# Patient Record
Sex: Male | Born: 1995 | Hispanic: Yes | Marital: Single | Smoking: Never smoker
Health system: Southern US, Community
[De-identification: ages and names within clinical notes are randomized; demographics above are authoritative.]

## PROBLEM LIST (undated history)

## (undated) HISTORY — PX: NO PAST SURGERIES: SHX2092

---
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
## 2017-02-05 ENCOUNTER — Emergency Department
Admission: EM | Admit: 2017-02-05 | Discharge: 2017-02-05 | Disposition: A | Discharge status: Home / Self Care | Payer: Medicaid Other | Attending: Emergency Medicine | Admitting: Emergency Medicine

## 2017-02-05 DIAGNOSIS — J029 Acute pharyngitis, unspecified: Secondary | ICD-10-CM | POA: Insufficient documentation

## 2017-02-05 MED ORDER — AMOXICILLIN 500 MG PO TABS
500.0000 mg | ORAL_TABLET | Freq: Three times a day (TID) | ORAL | 0 refills | Status: DC
Start: 2017-02-05 — End: 2022-01-13

## 2017-02-05 NOTE — ED Provider Notes (Signed)
[facility] Emergency Department Provider Note  ____________________________________________  Time seen: Approximately 7:28 PM  I have reviewed the triage vital signs and the nursing notes.   HISTORY  Chief Complaint URI   HPI Colton Armstrong is a 21 y.o. male who presents to the emergency department with left ear pain and sore throat for the past 2 weeks. He has been taking some over-the-counter pain relievers without relief.   History reviewed. No pertinent past medical history.  There are no active problems to display for this patient.   History reviewed. No pertinent surgical history.  Prior to Admission medications   Medication Sig Start Date End Date Taking? Authorizing Provider  amoxicillin (AMOXIL) 500 MG tablet Take 1 tablet (500 mg total) by mouth 3 (three) times daily. 02/05/17   Chinita Pesterari B Satoya Feeley, FNP    Allergies Patient has no known allergies.  No family history on file.  Social History Social History  Substance Use Topics  . Smoking status: Never Smoker  . Smokeless tobacco: Never Used  . Alcohol use No    Review of Systems Constitutional: Negative for fever/chills ENT: Positive for sore throat. Positive for left earache. Cardiovascular: Denies chest pain. Respiratory: Negative for shortness of breath. Negative for cough. Gastrointestinal: Negative for nausea,  negative for vomiting.  No diarrhea.  Musculoskeletal: Negative for body aches Skin: Negative for rash. Neurological: Negative for headaches ____________________________________________   PHYSICAL EXAM:  VITAL SIGNS: ED Triage Vitals  Enc Vitals Group     BP 02/05/17 1912 125/75     Pulse Rate 02/05/17 1911 (!) 101     Resp 02/05/17 1911 18     Temp 02/05/17 1911 98.7 F (37.1 C)     Temp Source 02/05/17 1911 Oral     SpO2 02/05/17 1911 100 %     Weight --      Height --      Head Circumference --      Peak Flow --      Pain Score 02/05/17 1858  6     Pain Loc --      Pain Edu? --      Excl. in GC? --     Constitutional: Alert and oriented. Uncomfortable appearing and in no acute distress. Eyes: Conjunctivae are normal. EOMI. Ears: Bilateral tympanic membranes are normal without signs of OM Nose: No congestion noted; no rhinnorhea. Mouth/Throat: Mucous membranes are moist.  Oropharynx erythematous. Tonsils 2+ without exudate. Neck: No stridor.  Lymphatic: Bilateral anterior cervical lymphadenopathy. Cardiovascular: Normal rate, regular rhythm. Good peripheral circulation. Respiratory: Normal respiratory effort.  No retractions. Breath sounds clear to auscultation throughout. Gastrointestinal: Soft and nontender.  Musculoskeletal: FROM x 4 extremities.  Neurologic:  Normal speech and language.  Skin:  Skin is warm, dry and intact. No rash noted. Psychiatric: Mood and affect are normal. Speech and behavior are normal.  ____________________________________________   LABS (all labs ordered are listed, but only abnormal results are displayed)  Labs Reviewed - No data to display ____________________________________________  EKG  Not indicated ____________________________________________  RADIOLOGY  Not indicated ____________________________________________   PROCEDURES  Procedure(s) performed: None  Critical Care performed: No ____________________________________________   INITIAL IMPRESSION / ASSESSMENT AND PLAN / ED COURSE  21 year old male bruising to the emergency department for a 2 week history of sore throat and has now developed a left earache. He'll be treated symptomatically with amoxicillin and advised to take Tylenol or ibuprofen for pain or fever. He is instructed to follow-up with  the primary care provider for choice for symptoms that are not improving over the next few days or return to emergency department for symptoms that change or worsen if he is unable to schedule an appointment.  Pertinent  labs & imaging results that were available during my care of the patient were reviewed by me and considered in my medical decision making (see chart for details).  Discharge Medication List as of 02/05/2017  8:12 PM    START taking these medications   Details  amoxicillin (AMOXIL) 500 MG tablet Take 1 tablet (500 mg total) by mouth 3 (three) times daily., Starting Sun 02/05/2017, Print        If controlled substance prescribed during this visit, 12 month history viewed on the [place] prior to issuing an initial prescription for Schedule II or III opiod. ____________________________________________   FINAL CLINICAL IMPRESSION(S) / ED DIAGNOSES  Final diagnoses:  Pharyngitis, unspecified etiology    Note:  This document was prepared using Dragon voice recognition software and may include unintentional dictation errors.     Chinita Pester, FNP 02/05/17 2332    Jennye Moccasin, MD 02/05/17 [phone]

## 2017-02-05 NOTE — ED Notes (Signed)
E-signature not working in room at this time. Computer has been restarted and pt verbalized that he had no further questions.

## 2017-02-05 NOTE — Discharge Instructions (Signed)
Please take tylenol or ibuprofen if needed for pain or fever. Follow up with the primary care provider of your choice for symptoms that are not improving over the next few days. Return to the ER for symptoms that change or worsen if unable to schedule an appointment.

## 2017-02-05 NOTE — ED Triage Notes (Signed)
Pt c/o left ear pain with sore throat for the past 2 weeks..Marland Kitchen

## 2017-03-05 ENCOUNTER — Emergency Department
Admission: EM | Admit: 2017-03-05 | Discharge: 2017-03-05 | Disposition: A | Discharge status: Home / Self Care | Payer: Self-pay | Attending: Emergency Medicine | Admitting: Emergency Medicine

## 2017-03-05 ENCOUNTER — Emergency Department: Payer: Self-pay

## 2017-03-05 DIAGNOSIS — J36 Peritonsillar abscess: Secondary | ICD-10-CM | POA: Insufficient documentation

## 2017-03-05 DIAGNOSIS — Z792 Long term (current) use of antibiotics: Secondary | ICD-10-CM | POA: Insufficient documentation

## 2017-03-05 LAB — CBC
HCT: 41.7 % (ref 40.0–52.0)
Hemoglobin: 14.5 g/dL (ref 13.0–18.0)
MCH: 31.6 pg (ref 26.0–34.0)
MCHC: 34.9 g/dL (ref 32.0–36.0)
MCV: 90.4 fL (ref 80.0–100.0)
Platelets: 233 10*3/uL (ref 150–440)
RBC: 4.61 MIL/uL (ref 4.40–5.90)
RDW: 12.7 % (ref 11.5–14.5)
WBC: 17.9 10*3/uL — ABNORMAL HIGH (ref 3.8–10.6)

## 2017-03-05 LAB — COMPREHENSIVE METABOLIC PANEL
ALT: 49 U/L (ref 17–63)
AST: 23 U/L (ref 15–41)
Albumin: 4.7 g/dL (ref 3.5–5.0)
Alkaline Phosphatase: 105 U/L (ref 38–126)
Anion gap: 12 (ref 5–15)
BUN: 11 mg/dL (ref 6–20)
CO2: 26 mmol/L (ref 22–32)
Calcium: 9.6 mg/dL (ref 8.9–10.3)
Chloride: 96 mmol/L — ABNORMAL LOW (ref 101–111)
Creatinine, Ser: 1.06 mg/dL (ref 0.61–1.24)
GFR calc Af Amer: >60 mL/min (ref >60)
GFR calc non Af Amer: >60 mL/min (ref >60)
Glucose, Bld: 88 mg/dL (ref 65–99)
Potassium: 2.9 mmol/L — ABNORMAL LOW (ref 3.5–5.1)
Sodium: 134 mmol/L — ABNORMAL LOW (ref 135–145)
Total Bilirubin: 0.7 mg/dL (ref 0.3–1.2)
Total Protein: 9.1 g/dL — ABNORMAL HIGH (ref 6.5–8.1)

## 2017-03-05 MED ORDER — IOPAMIDOL (ISOVUE-300) INJECTION 61%
75.0000 mL | Freq: Once | INTRAVENOUS | Status: AC | PRN
  Administered 2017-03-05: 75 mL via INTRAVENOUS
  Filled 2017-03-05: qty 75

## 2017-03-05 MED ORDER — AMOXICILLIN-POT CLAVULANATE 875-125 MG PO TABS: 1.0000 | ORAL_TABLET | Freq: Two times a day (BID) | ORAL | 0 refills | Status: AC

## 2017-03-05 MED ORDER — METHYLPREDNISOLONE SODIUM SUCC 125 MG IJ SOLR
125.0000 mg | Freq: Once | INTRAMUSCULAR | Status: AC
  Administered 2017-03-05: 125 mg via INTRAVENOUS

## 2017-03-05 MED ORDER — PIPERACILLIN-TAZOBACTAM 3.375 G IVPB 30 MIN
3.3750 g | Freq: Once | INTRAVENOUS | Status: AC
  Administered 2017-03-05: 3.375 g via INTRAVENOUS
  Filled 2017-03-05: qty 50

## 2017-03-05 MED ORDER — DEXAMETHASONE 1.5 MG (21) PO TBPK: 1.5000 mg | ORAL_TABLET | Freq: Every day | ORAL | 0 refills | Status: DC

## 2017-03-05 MED ORDER — LIDOCAINE VISCOUS 2 % MT SOLN: 10.0000 mL | OROMUCOSAL | 0 refills | Status: DC | PRN

## 2017-03-05 MED ORDER — METHYLPREDNISOLONE SODIUM SUCC 125 MG IJ SOLR
125.0000 mg | Freq: Once | INTRAMUSCULAR | Status: DC
  Filled 2017-03-05: qty 2

## 2017-03-05 NOTE — ED Triage Notes (Signed)
Patient comes in POV with family with complaints of sore throat and left ear pain for the last 3 days. Patient states he had a fever of 101 yesterday morning. Patient took amoxicillin and Ibprofen for the pain.  Patient states no relief. No vomiting but some nausea. No fever at this time. Respirations even and unlabored.

## 2017-03-05 NOTE — ED Provider Notes (Signed)
[facility] Emergency Department Provider Note  ____________________________________________  Time seen: Approximately 4:41 PM  I have reviewed the triage vital signs and the nursing notes.   HISTORY  Chief Complaint Sore Throat and Otalgia    HPI Colton Armstrong is a 21 y.o. male that presents to the emergency department with sore throat for 3 days. Patient has had difficulty swallowing due to pain. Patient states that he took ibuprofen and a dose of amoxicillin yesterday without relief.No sick contacts. He denies fever, congestion, cough, shortness of breath, chest pain, nausea, vomiting, abdominal pain.   History reviewed. No pertinent past medical history.  There are no active problems to display for this patient.   History reviewed. No pertinent surgical history.  Prior to Admission medications   Medication Sig Start Date End Date Taking? Authorizing Provider  amoxicillin (AMOXIL) 500 MG tablet Take 1 tablet (500 mg total) by mouth 3 (three) times daily. 02/05/17   Chinita Pester, FNP  amoxicillin-clavulanate (AUGMENTIN) 875-125 MG tablet Take 1 tablet by mouth 2 (two) times daily. 03/05/17 03/15/17  Enid Derry, PA-C  Dexamethasone (DEXPAK 6 DAY) 1.5 MG (21) TBPK Take 1.5 mg by mouth daily. Take 6 pills on day 1. Take 5 pills on day 2. Take 4 pills on day 3. Take 3 pills on day 4. Take 2 pills on day 5. Take 1 pill on day 6 03/05/17   Enid Derry, PA-C  lidocaine (XYLOCAINE) 2 % solution Use as directed 10 mLs in the mouth or throat as needed for mouth pain. 03/05/17   Enid Derry, PA-C    Allergies Patient has no known allergies.  No family history on file.  Social History Social History  Substance Use Topics  . Smoking status: Never Smoker  . Smokeless tobacco: Never Used  . Alcohol use No     Review of Systems  Constitutional: No fever/chills ENT: Positive for sore throat. Cardiovascular: No chest pain. Respiratory: No  cough. No SOB. Gastrointestinal: No abdominal pain.  No nausea, no vomiting.  Musculoskeletal: Negative for musculoskeletal pain. Skin: Negative for rash, abrasions, lacerations, ecchymosis. Neurological: Negative for headaches, numbness or tingling   ____________________________________________   PHYSICAL EXAM:  VITAL SIGNS: ED Triage Vitals  Enc Vitals Group     BP 03/05/17 1559 (!) 153/73     Pulse Rate 03/05/17 1559 (!) 107     Resp 03/05/17 1559 18     Temp 03/05/17 1559 98.5 F (36.9 C)     Temp Source 03/05/17 1559 Oral     SpO2 03/05/17 1559 97 %     Weight 03/05/17 1601 170 lb (77.1 kg)     Height 03/05/17 1601  (1.626 m)     Head Circumference --      Peak Flow --      Pain Score 03/05/17 1559 9     Pain Loc --      Pain Edu? --      Excl. in GC? --      Constitutional: Alert and oriented. Well appearing and in no acute distress. Eyes: Conjunctivae are normal. PERRL. EOMI. Head: Atraumatic. ENT:      Ears: Tympanic membranes pearly gray with good landmarks.      Nose: No congestion/rhinnorhea.      Mouth/Throat: Mucous membranes are moist. Oropharynx erythematous. Left tonsil enlarged. Uvula deviated to the right. No exudates.  Neck: No stridor. Cardiovascular: Normal rate, regular rhythm.  Good peripheral circulation. Respiratory: Normal respiratory effort without  tachypnea or retractions. Lungs CTAB. Good air entry to the bases with no decreased or absent breath sounds. Gastrointestinal: Bowel sounds 4 quadrants. Soft and nontender to palpation. No guarding or rigidity. No palpable masses. No distention. Musculoskeletal: Full range of motion to all extremities. No gross deformities appreciated. Neurologic:  Normal speech and language. No gross focal neurologic deficits are appreciated.  Skin:  Skin is warm, dry and intact. No rash noted.  ____________________________________________   LABS (all labs ordered are listed, but only abnormal results are  displayed)  Labs Reviewed  CBC - Abnormal; Notable for the following:       Result Value   WBC 17.9 (*)    All other components within normal limits  COMPREHENSIVE METABOLIC PANEL - Abnormal; Notable for the following:    Sodium 134 (*)    Potassium 2.9 (*)    Chloride 96 (*)    Total Protein 9.1 (*)    All other components within normal limits   ____________________________________________  EKG   ____________________________________________  RADIOLOGY   Ct Soft Tissue Neck W Contrast  Result Date: 03/05/2017 CLINICAL DATA:  Initial evaluation for sore throat with left ear pain for 3 days. EXAM: CT NECK WITH CONTRAST TECHNIQUE: Multidetector CT imaging of the neck was performed using the standard protocol following the bolus administration of intravenous contrast. CONTRAST:  75mL ISOVUE-300 IOPAMIDOL (ISOVUE-300) INJECTION 61% COMPARISON:  None. FINDINGS: Pharynx and larynx: Oral cavity within normal limits. No acute abnormality about the dentition. Palatine tonsils enlarged, left greater than right. 3.1 x 1.9 x 2.7 cm hypodense collection at the left palatine tonsil, compatible with tonsillar/peritonsillar abscess (series 2, image 30). Left palatine tonsil enlarged and abuts the uvula medially. Associated induration within the left parapharyngeal fat. Nasopharynx within normal limits. Right parapharyngeal fat preserved. Associated edema within the right oropharynx extending inferiorly towards the piriform sinus. No retropharyngeal abscess or collection. Epiglottis normal. Vallecula clear. Remainder of the hypopharynx and supraglottic larynx normal. True cords symmetric and normal. Subglottic airway clear. Salivary glands: Parotid and submandibular glands within normal limits. Mild inflammatory stranding within the left submandibular space related to the left tonsillar abscess. Thyroid: Thyroid normal. Lymph nodes: Mildly enlarged left level 2 lymph nodes measure up to 13 mm, likely  reactive. No other adenopathy within the neck. Vascular: Normal intravascular enhancement seen throughout the neck. Limited intracranial: Unremarkable. Visualized orbits: Visualized globes and orbits unremarkable. Mastoids and visualized paranasal sinuses: Small retention cysts noted within the right maxillary sinus. Visualized paranasal sinuses are otherwise clear. Mastoids and middle ear cavities are clear. Skeleton: Visualized osseous structures within normal limits. No worrisome lytic or blastic osseous lesions. Upper chest: Visualized upper mediastinum within normal limits. Visualized lungs are clear. Other: None. IMPRESSION: 1. 3.9 x 1.9 x 2.7 cm left tonsillar/peritonsillar abscess the. Associated inflammatory stranding within the adjacent left parapharyngeal and submandibular spaces. Mucosal edema within the left pharynx compatible with associated pharyngitis. 2. Mildly prominent left level 2 lymph nodes, likely reactive. Electronically Signed   By: Rise Mu M.D.   On: 03/05/2017 18:56    ____________________________________________    PROCEDURES  Procedure(s) performed:    Procedures    Medications  piperacillin-tazobactam (ZOSYN) IVPB 3.375 g (0 g Intravenous Stopped 03/05/17 1740)  methylPREDNISolone sodium succinate (SOLU-MEDROL) 125 mg/2 mL injection 125 mg (125 mg Intravenous Given 03/05/17 1704)  iopamidol (ISOVUE-300) 61 % injection 75 mL (75 mLs Intravenous Contrast Given 03/05/17 1749)     ____________________________________________   INITIAL IMPRESSION / ASSESSMENT AND PLAN /  ED COURSE  Pertinent labs & imaging results that were available during my care of the patient were reviewed by me and considered in my medical decision making (see chart for details).  Review of the [place] CSRS was performed in accordance of the [facility] prior to dispensing any controlled drugs.   Patient's diagnosis is consistent with Peritonsillar abscess. Vital signs and exam are reassuring.  CT consistent with peritonsillar abscess. No area of fluctuance that I am able to drain. Patient was given Zosyn and solumedrol in ED. Dr. Elenore Rota was consulted and he recommended that patient be discharged with Augmentin and decadron and to follow up with him this week.  Patient was able to drink apple juice at discharge without difficulty.  Patient is given ED precautions to return to the ED for any worsening or new symptoms.     ____________________________________________  FINAL CLINICAL IMPRESSION(S) / ED DIAGNOSES  Final diagnoses:  Peritonsillar abscess      NEW MEDICATIONS STARTED DURING THIS VISIT:  New Prescriptions   AMOXICILLIN-CLAVULANATE (AUGMENTIN) 875-125 MG TABLET    Take 1 tablet by mouth 2 (two) times daily.   DEXAMETHASONE (DEXPAK 6 DAY) 1.5 MG (21) TBPK    Take 1.5 mg by mouth daily. Take 6 pills on day 1. Take 5 pills on day 2. Take 4 pills on day 3. Take 3 pills on day 4. Take 2 pills on day 5. Take 1 pill on day 6   LIDOCAINE (XYLOCAINE) 2 % SOLUTION    Use as directed 10 mLs in the mouth or throat as needed for mouth pain.        This chart was dictated using voice recognition software/Dragon. Despite best efforts to proofread, errors can occur which can change the meaning. Any change was purely unintentional.    Enid Derry, PA-C 03/05/17 2120    Merrily Brittle, MD 03/08/17 1057

## 2017-03-06 MED ORDER — PREDNISONE 10 MG (21) PO TBPK: ORAL_TABLET | ORAL | 0 refills | Status: DC

## 2020-10-02 ENCOUNTER — Ambulatory Visit: Payer: Self-pay | Admitting: Orthopaedic Surgery

## 2020-10-02 ENCOUNTER — Ambulatory Visit (INDEPENDENT_AMBULATORY_CARE_PROVIDER_SITE_OTHER): Payer: Self-pay

## 2020-10-02 ENCOUNTER — Encounter: Payer: Self-pay | Admitting: Orthopaedic Surgery

## 2020-10-02 VITALS — Ht 64.0 in | Wt 170.0 lb

## 2020-10-02 DIAGNOSIS — M25562 Pain in left knee: Secondary | ICD-10-CM

## 2020-10-02 DIAGNOSIS — G8929 Other chronic pain: Secondary | ICD-10-CM

## 2020-10-02 MED ORDER — MELOXICAM 7.5 MG PO TABS: 7.5000 mg | ORAL_TABLET | Freq: Two times a day (BID) | ORAL | 2 refills | Status: DC | PRN

## 2020-10-02 NOTE — Progress Notes (Signed)
Office Visit Note   Patient: Colton Armstrong           Date of Birth: 1996-10-08           MRN: [id number] Visit Date: 10/02/2020              Requested by: No referring provider defined for this encounter. PCP: Patient, No Pcp Per   Assessment & Plan: Visit Diagnoses:  1. Chronic pain of left knee     Plan: Impression is chronic left knee pain.  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  I recommend outpatient PT for strengthening as well as kinesiotaping.  I do not feel that injection is likely going to be beneficial.  I have recommended trying Voltaren gel and I have also sent in a prescription for meloxicam to try.  We will see him back as needed.  Follow-Up Instructions: Return if symptoms worsen or fail to improve.   Orders:  Orders Placed This Encounter  Procedures  . XR KNEE 3 VIEW LEFT  . Ambulatory referral to Physical Therapy   Meds ordered this encounter  Medications  . meloxicam (MOBIC) 7.5 MG tablet    Sig: Take 1 tablet (7.5 mg total) by mouth 2 (two) times daily as needed for pain.    Dispense:  30 tablet    Refill:  2      Procedures: No procedures performed   Clinical Data: No additional findings.   Subjective: Chief Complaint  Patient presents with  . Left Knee - Pain    Colton Armstrong is a 24 year old gentleman comes in for evaluation of chronic left knee pain for about 2 years.  He denies specific injury or trauma.  He is very active and plays soccer and he also has a physically strenuous job.  He states that he has throbbing after he sits in a car for a long time or after he has been playing soccer.  Not taking any medications.  Occasionally swells but resolves quickly.  He states that he feels pain at the superior lateral region of the patella.  Going up stairs occasionally will cause pain   Review of Systems  Constitutional: Negative.   All other systems reviewed and are  negative.    Objective: Vital Signs: Ht 5\' 4"  (1.626 m)   Wt 170 lb (77.1 kg)   BMI 29.18 kg/m   Physical Exam Vitals and nursing note reviewed.  Constitutional:      Appearance: He is well-developed.  HENT:     Head: Normocephalic and atraumatic.  Eyes:     Pupils: Pupils are equal, round, and reactive to light.  Pulmonary:     Effort: Pulmonary effort is normal.  Abdominal:     Palpations: Abdomen is soft.  Musculoskeletal:        General: Normal range of motion.     Cervical back: Neck supple.  Skin:    General: Skin is warm.  Neurological:     Mental Status: He is alert and oriented to person, place, and time.  Psychiatric:        Behavior: Behavior normal.        Thought Content: Thought content normal.        Judgment: Judgment normal.     Ortho Exam Left knee shows no joint effusion normal range of motion.  Patella tracking is normal.  No joint line tenderness.  Collaterals and cruciates are stable.  Patella mobility is normal.  I  do not feel any abnormalities at the superior lateral region of the patella with range of motion of the knee. Specialty Comments:  No specialty comments available.  Imaging: XR KNEE 3 VIEW LEFT  Result Date: 10/02/2020 No acute or structural abnormalities    PMFS History: There are no problems to display for this patient.  History reviewed. No pertinent past medical history.  History reviewed. No pertinent family history.  History reviewed. No pertinent surgical history. Social History   Occupational History  . Not on file  Tobacco Use  . Smoking status: Never Smoker  . Smokeless tobacco: Never Used  Substance and Sexual Activity  . Alcohol use: No  . Drug use: No  . Sexual activity: Not on file

## 2020-11-19 ENCOUNTER — Other Ambulatory Visit: Payer: Self-pay | Admitting: Orthopaedic Surgery

## 2021-01-27 ENCOUNTER — Other Ambulatory Visit: Payer: Self-pay | Admitting: Physician Assistant

## 2021-11-28 DIAGNOSIS — S62309A Unspecified fracture of unspecified metacarpal bone, initial encounter for closed fracture: Secondary | ICD-10-CM

## 2021-11-28 HISTORY — DX: Unspecified fracture of unspecified metacarpal bone, initial encounter for closed fracture: S62.309A

## 2022-01-11 ENCOUNTER — Other Ambulatory Visit: Payer: Self-pay

## 2022-01-11 ENCOUNTER — Encounter (HOSPITAL_COMMUNITY): Payer: Self-pay

## 2022-01-11 ENCOUNTER — Emergency Department (HOSPITAL_COMMUNITY)
Admission: EM | Admit: 2022-01-11 | Discharge: 2022-01-12 | Disposition: A | Discharge status: Home / Self Care | Payer: Self-pay | Attending: Emergency Medicine | Admitting: Emergency Medicine

## 2022-01-11 ENCOUNTER — Emergency Department (HOSPITAL_COMMUNITY): Payer: Self-pay

## 2022-01-11 DIAGNOSIS — S62324A Displaced fracture of shaft of fourth metacarpal bone, right hand, initial encounter for closed fracture: Secondary | ICD-10-CM | POA: Insufficient documentation

## 2022-01-11 DIAGNOSIS — Z7952 Long term (current) use of systemic steroids: Secondary | ICD-10-CM | POA: Insufficient documentation

## 2022-01-11 DIAGNOSIS — S62316A Displaced fracture of base of fifth metacarpal bone, right hand, initial encounter for closed fracture: Secondary | ICD-10-CM | POA: Insufficient documentation
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
## 2022-01-11 NOTE — Discharge Instructions (Signed)
You were seen today for a hand injury.  You have fractures of the fourth and the fifth metacarpals.  This is classically known as a boxer's fracture.  You need to follow-up with hand surgery.  Keep splint in place.  Keep iced and elevated.
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
## 2022-01-12 NOTE — Progress Notes (Signed)
Orthopedic Tech Progress Note Patient Details:  Colton Armstrong 1996-07-07 [id number]  Ortho Devices Type of Ortho Device: Ulna gutter splint Ortho Device/Splint Location: rle Ortho Device/Splint Interventions: Ordered, Application, Adjustment   Post Interventions Patient Tolerated: Well  [name] 01/12/2022, 12:22 AM

## 2022-01-13 ENCOUNTER — Other Ambulatory Visit: Payer: Self-pay | Admitting: Orthopedic Surgery

## 2022-01-14 ENCOUNTER — Other Ambulatory Visit: Payer: Self-pay

## 2022-01-14 ENCOUNTER — Encounter (HOSPITAL_BASED_OUTPATIENT_CLINIC_OR_DEPARTMENT_OTHER): Payer: Self-pay | Admitting: Orthopedic Surgery
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
## 2022-01-18 ENCOUNTER — Other Ambulatory Visit: Payer: Self-pay

## 2022-01-18 ENCOUNTER — Encounter (HOSPITAL_BASED_OUTPATIENT_CLINIC_OR_DEPARTMENT_OTHER): Payer: Self-pay | Admitting: Orthopedic Surgery

## 2022-01-19 ENCOUNTER — Encounter (HOSPITAL_BASED_OUTPATIENT_CLINIC_OR_DEPARTMENT_OTHER)
Admission: RE | Disposition: A | Discharge status: Home / Self Care | Payer: Self-pay | Source: Ambulatory Visit | Attending: Orthopedic Surgery

## 2022-01-19 ENCOUNTER — Ambulatory Visit (HOSPITAL_BASED_OUTPATIENT_CLINIC_OR_DEPARTMENT_OTHER): Payer: Self-pay | Admitting: Certified Registered"

## 2022-01-19 ENCOUNTER — Encounter (HOSPITAL_BASED_OUTPATIENT_CLINIC_OR_DEPARTMENT_OTHER): Payer: Self-pay | Admitting: Orthopedic Surgery

## 2022-01-19 ENCOUNTER — Ambulatory Visit (HOSPITAL_BASED_OUTPATIENT_CLINIC_OR_DEPARTMENT_OTHER)
Admission: RE | Admit: 2022-01-19 | Discharge: 2022-01-19 | Disposition: A | Discharge status: Home / Self Care | Payer: Self-pay | Source: Ambulatory Visit | Attending: Orthopedic Surgery | Admitting: Orthopedic Surgery

## 2022-01-19 ENCOUNTER — Ambulatory Visit (HOSPITAL_COMMUNITY): Payer: Self-pay

## 2022-01-19 ENCOUNTER — Other Ambulatory Visit: Payer: Self-pay

## 2022-01-19 DIAGNOSIS — Y9366 Activity, soccer: Secondary | ICD-10-CM | POA: Insufficient documentation

## 2022-01-19 DIAGNOSIS — Z419 Encounter for procedure for purposes other than remedying health state, unspecified: Secondary | ICD-10-CM

## 2022-01-19 DIAGNOSIS — S62324A Displaced fracture of shaft of fourth metacarpal bone, right hand, initial encounter for closed fracture: Secondary | ICD-10-CM | POA: Insufficient documentation

## 2022-01-19 DIAGNOSIS — S62316A Displaced fracture of base of fifth metacarpal bone, right hand, initial encounter for closed fracture: Secondary | ICD-10-CM

## 2022-01-19 HISTORY — PX: CLOSED REDUCTION METACARPAL WITH PERCUTANEOUS PINNING: SHX5613

## 2022-01-19 HISTORY — PX: OPEN REDUCTION INTERNAL FIXATION (ORIF) METACARPAL: SHX6234

## 2022-01-19 SURGERY — OPEN REDUCTION INTERNAL FIXATION (ORIF) METACARPAL
Anesthesia: Monitor Anesthesia Care | Site: Hand | Laterality: Right

## 2022-01-19 MED ORDER — CEFAZOLIN SODIUM-DEXTROSE 2-4 GM/100ML-% IV SOLN
INTRAVENOUS | Status: AC
  Filled 2022-01-19: qty 100

## 2022-01-19 MED ORDER — ACETAMINOPHEN 325 MG PO TABS: 650.0000 mg | ORAL_TABLET | Freq: Four times a day (QID) | ORAL | Status: AC

## 2022-01-19 MED ORDER — MIDAZOLAM HCL 2 MG/2ML IJ SOLN
2.0000 mg | Freq: Once | INTRAMUSCULAR | Status: AC
Start: 2022-01-19 — End: 2022-01-19
  Administered 2022-01-19: 2 mg via INTRAVENOUS

## 2022-01-19 MED ORDER — ACETAMINOPHEN 500 MG PO TABS
ORAL_TABLET | ORAL | Status: AC
  Filled 2022-01-19: qty 2

## 2022-01-19 MED ORDER — DEXAMETHASONE SODIUM PHOSPHATE 10 MG/ML IJ SOLN
INTRAMUSCULAR | Status: DC | PRN
  Administered 2022-01-19: 5 mg

## 2022-01-19 MED ORDER — PROPOFOL 500 MG/50ML IV EMUL
INTRAVENOUS | Status: DC | PRN
  Administered 2022-01-19: 100 ug/kg/min via INTRAVENOUS

## 2022-01-19 MED ORDER — IBUPROFEN 200 MG PO TABS: 600.0000 mg | ORAL_TABLET | Freq: Four times a day (QID) | ORAL | Status: AC

## 2022-01-19 MED ORDER — PROPOFOL 500 MG/50ML IV EMUL
INTRAVENOUS | Status: AC
  Filled 2022-01-19: qty 50

## 2022-01-19 MED ORDER — OXYCODONE HCL 5 MG PO TABS
5.0000 mg | ORAL_TABLET | Freq: Four times a day (QID) | ORAL | 0 refills | Status: AC | PRN
Start: 2022-01-19 — End: ?

## 2022-01-19 MED ORDER — MIDAZOLAM HCL 2 MG/2ML IJ SOLN
INTRAMUSCULAR | Status: AC
  Filled 2022-01-19: qty 2

## 2022-01-19 MED ORDER — ONDANSETRON HCL 4 MG/2ML IJ SOLN
INTRAMUSCULAR | Status: AC
  Filled 2022-01-19: qty 2

## 2022-01-19 MED ORDER — FENTANYL CITRATE (PF) 100 MCG/2ML IJ SOLN
100.0000 ug | Freq: Once | INTRAMUSCULAR | Status: AC
  Administered 2022-01-19: 100 ug via INTRAVENOUS

## 2022-01-19 MED ORDER — LACTATED RINGERS IV SOLN: INTRAVENOUS | Status: DC

## 2022-01-19 MED ORDER — ROPIVACAINE HCL 5 MG/ML IJ SOLN
INTRAMUSCULAR | Status: DC | PRN
  Administered 2022-01-19: 25 mL via PERINEURAL

## 2022-01-19 MED ORDER — ACETAMINOPHEN 500 MG PO TABS
1000.0000 mg | ORAL_TABLET | Freq: Once | ORAL | Status: AC
  Administered 2022-01-19: 1000 mg via ORAL

## 2022-01-19 MED ORDER — FENTANYL CITRATE (PF) 100 MCG/2ML IJ SOLN
INTRAMUSCULAR | Status: AC
  Filled 2022-01-19: qty 2

## 2022-01-19 MED ORDER — FENTANYL CITRATE (PF) 100 MCG/2ML IJ SOLN: 25.0000 ug | INTRAMUSCULAR | Status: DC | PRN

## 2022-01-19 MED ORDER — ONDANSETRON HCL 4 MG/2ML IJ SOLN
INTRAMUSCULAR | Status: DC | PRN
  Administered 2022-01-19: 4 mg via INTRAVENOUS

## 2022-01-19 MED ORDER — CEFAZOLIN SODIUM-DEXTROSE 2-4 GM/100ML-% IV SOLN
2.0000 g | INTRAVENOUS | Status: AC
  Administered 2022-01-19: 2 g via INTRAVENOUS

## 2022-01-19 SURGICAL SUPPLY — 53 items
APL PRP STRL LF DISP 70% ISPRP (MISCELLANEOUS) ×1
BLADE MINI RND TIP GREEN BEAV (BLADE) IMPLANT
BLADE SURG 15 STRL LF DISP TIS (BLADE) ×2 IMPLANT
BLADE SURG 15 STRL SS (BLADE) ×2
BNDG CMPR 9X4 STRL LF SNTH (GAUZE/BANDAGES/DRESSINGS) ×1
BNDG COHESIVE 4X5 TAN ST LF (GAUZE/BANDAGES/DRESSINGS) ×3 IMPLANT
BNDG ESMARK 4X9 LF (GAUZE/BANDAGES/DRESSINGS) ×3 IMPLANT
BNDG GAUZE ELAST 4 BULKY (GAUZE/BANDAGES/DRESSINGS) ×3 IMPLANT
CANISTER SUCT 1200ML W/VALVE (MISCELLANEOUS) IMPLANT
CHLORAPREP W/TINT 26 (MISCELLANEOUS) ×3 IMPLANT
CORD BIPOLAR FORCEPS 12FT (ELECTRODE) ×3 IMPLANT
COVER BACK TABLE 60X90IN (DRAPES) ×3 IMPLANT
COVER MAYO STAND STRL (DRAPES) ×3 IMPLANT
CUFF TOURN SGL QUICK 18X4 (TOURNIQUET CUFF) IMPLANT
DRAPE C-ARM 42X72 X-RAY (DRAPES) ×3 IMPLANT
DRAPE EXTREMITY T 121X128X90 (DISPOSABLE) ×3 IMPLANT
DRAPE SURG 17X23 STRL (DRAPES) ×2 IMPLANT
DRAPE U-SHAPE 47X51 STRL (DRAPES) ×1 IMPLANT
DRSG EMULSION OIL 3X3 NADH (GAUZE/BANDAGES/DRESSINGS) ×2 IMPLANT
GAUZE SPONGE 4X4 12PLY STRL LF (GAUZE/BANDAGES/DRESSINGS) ×3 IMPLANT
GLOVE SRG 8 PF TXTR STRL LF DI (GLOVE) ×2 IMPLANT
GLOVE SURG ENC MOIS LTX SZ7.5 (GLOVE) ×3 IMPLANT
GLOVE SURG LTX SZ6.5 (GLOVE) ×3 IMPLANT
GLOVE SURG POLYISO LF SZ6.5 (GLOVE) ×1 IMPLANT
GLOVE SURG UNDER POLY LF SZ6.5 (GLOVE) ×1 IMPLANT
GLOVE SURG UNDER POLY LF SZ7 (GLOVE) ×4 IMPLANT
GLOVE SURG UNDER POLY LF SZ8 (GLOVE) ×2
GOWN STRL REUS W/ TWL LRG LVL3 (GOWN DISPOSABLE) ×4 IMPLANT
GOWN STRL REUS W/TWL LRG LVL3 (GOWN DISPOSABLE) ×4
GOWN STRL REUS W/TWL XL LVL3 (GOWN DISPOSABLE) ×3 IMPLANT
K-WIRE .045X4 (WIRE) ×2 IMPLANT
K-WIRE .062X4 (WIRE) ×1 IMPLANT
NDL HYPO 25X1 1.5 SAFETY (NEEDLE) IMPLANT
NEEDLE HYPO 25X1 1.5 SAFETY (NEEDLE) IMPLANT
NS IRRIG 1000ML POUR BTL (IV SOLUTION) ×3 IMPLANT
PACK BASIN DAY SURGERY FS (CUSTOM PROCEDURE TRAY) ×3 IMPLANT
PADDING CAST ABS 4INX4YD NS (CAST SUPPLIES) ×1
PADDING CAST ABS COTTON 4X4 ST (CAST SUPPLIES) IMPLANT
SLEEVE SCD COMPRESS KNEE MED (STOCKING) ×2 IMPLANT
SPLINT PLASTER CAST XFAST 3X15 (CAST SUPPLIES) ×2 IMPLANT
SPLINT PLASTER XTRA FASTSET 3X (CAST SUPPLIES) ×5
STOCKINETTE 6  STRL (DRAPES) ×2
STOCKINETTE 6 STRL (DRAPES) ×2 IMPLANT
SUCTION FRAZIER HANDLE 10FR (MISCELLANEOUS)
SUCTION TUBE FRAZIER 10FR DISP (MISCELLANEOUS) IMPLANT
SUT VIC AB 3-0 PS2 18 (SUTURE) IMPLANT
SUT VICRYL RAPIDE 4-0 (SUTURE) IMPLANT
SUT VICRYL RAPIDE 4/0 PS 2 (SUTURE) IMPLANT
SYR 10ML LL (SYRINGE) IMPLANT
SYR BULB EAR ULCER 3OZ GRN STR (SYRINGE) ×1 IMPLANT
TOWEL GREEN STERILE FF (TOWEL DISPOSABLE) ×3 IMPLANT
TUBE CONNECTING 20X1/4 (TUBING) IMPLANT
UNDERPAD 30X36 HEAVY ABSORB (UNDERPADS AND DIAPERS) ×3 IMPLANT

## 2022-01-19 NOTE — Transfer of Care (Signed)
Immediate Anesthesia Transfer of Care Note  Patient: Easten Maceachern  Procedure(s) Performed: OPEN TREATMENT OF RIGHT 4TH AND 5TH METACARPAL FRACTURES (Right: Finger)  Patient Location: PACU  Anesthesia Type:MAC combined with regional for post-op pain  Level of Consciousness: awake, alert  and oriented  Airway & Oxygen Therapy: Patient Spontanous Breathing and Patient connected to face mask oxygen  Post-op Assessment: Report given to RN and Post -op Vital signs reviewed and stable  Post vital signs: Reviewed and stable  Last Vitals:  BP 122/72 (88) Vitals Value Taken Time  BP    Temp    Pulse 53 01/19/22 1317  Resp 12 01/19/22 1317  SpO2 100 % 01/19/22 1317  Vitals shown include unvalidated device data.  Last Pain:  Vitals:   01/19/22 1102  TempSrc: Oral  PainSc: 0-No pain         Complications: No notable events documented.

## 2022-01-19 NOTE — Anesthesia Preprocedure Evaluation (Addendum)
Anesthesia Evaluation  Patient identified by MRN, date of birth, ID band Patient awake    Reviewed: Allergy & Precautions, NPO status , Patient's Chart, lab work & pertinent test results  Airway Mallampati: I  TM Distance: >3 FB Neck ROM: Full    Dental no notable dental hx. (+) Teeth Intact, Dental Advisory Given   Pulmonary neg pulmonary ROS,    Pulmonary exam normal breath sounds clear to auscultation       Cardiovascular negative cardio ROS Normal cardiovascular exam Rhythm:Regular Rate:Normal     Neuro/Psych negative neurological ROS  negative psych ROS   GI/Hepatic negative GI ROS, Neg liver ROS,   Endo/Other  negative endocrine ROS  Renal/GU negative Renal ROS  negative genitourinary   Musculoskeletal negative musculoskeletal ROS (+)   Abdominal   Peds  Hematology negative hematology ROS (+)   Anesthesia Other Findings   Reproductive/Obstetrics                            Anesthesia Physical Anesthesia Plan  ASA: 1  Anesthesia Plan: MAC and Regional   Post-op Pain Management: Regional block* and Tylenol PO (pre-op)*   Induction: Intravenous  PONV Risk Score and Plan: 1 and Propofol infusion, Treatment may vary due to age or medical condition, Midazolam, Ondansetron and Dexamethasone  Airway Management Planned: Natural Airway  Additional Equipment:   Intra-op Plan:   Post-operative Plan:   Informed Consent: I have reviewed the patients History and Physical, chart, labs and discussed the procedure including the risks, benefits and alternatives for the proposed anesthesia with the patient or authorized representative who has indicated his/her understanding and acceptance.     Dental advisory given  Plan Discussed with: CRNA  Anesthesia Plan Comments:         Anesthesia Quick Evaluation

## 2022-01-19 NOTE — Anesthesia Procedure Notes (Signed)
Anesthesia Regional Block: Supraclavicular block   Pre-Anesthetic Checklist: , timeout performed,  Correct Patient, Correct Site, Correct Laterality,  Correct Procedure, Correct Position, site marked,  Risks and benefits discussed,  Surgical consent,  Pre-op evaluation,  At surgeon's request and post-op pain management  Laterality: Right  Prep: Maximum Sterile Barrier Precautions used, chloraprep       Needles:  Injection technique: Single-shot  Needle Type: Echogenic Stimulator Needle     Needle Length: 5cm  Needle Gauge: 22     Additional Needles:   Procedures:,,,, ultrasound used (permanent image in chart),,    Narrative:  Start time: 01/19/2022 12:05 PM End time: 01/19/2022 12:11 PM Injection made incrementally with aspirations every 5 mL.  Performed by: Personally  Anesthesiologist: Elmer Picker, MD  Additional Notes: Monitors applied. No increased pain on injection. No increased resistance to injection. Injection made in 5cc increments. Good needle visualization. Patient tolerated procedure well.

## 2022-01-19 NOTE — Anesthesia Postprocedure Evaluation (Signed)
Anesthesia Post Note  Patient: Colton Armstrong  Procedure(s) Performed: OPEN TREATMENT OF RIGHT 4TH METACARPAL FRACTURE (Right: Hand) CLOSED REDUCTION 5TH METACARPAL WITH PERCUTANEOUS PINNING (Right: Hand)     Patient location during evaluation: PACU Anesthesia Type: Regional and MAC Level of consciousness: awake and alert Pain management: pain level controlled Vital Signs Assessment: post-procedure vital signs reviewed and stable Respiratory status: spontaneous breathing, nonlabored ventilation, respiratory function stable and patient connected to nasal cannula oxygen Cardiovascular status: stable and blood pressure returned to baseline Postop Assessment: no apparent nausea or vomiting Anesthetic complications: no   No notable events documented.  Last Vitals:  Vitals:   01/19/22 1330 01/19/22 1356  BP: 128/71 133/63  Pulse: (!) 51 (!) 55  Resp: (!) 22 20  Temp:  36.7 C  SpO2: 99% 98%    Last Pain:  Vitals:   01/19/22 1356  TempSrc:   PainSc: 0-No pain                 [name]

## 2022-01-19 NOTE — Interval H&P Note (Signed)
History and Physical Interval Note:  01/19/2022 11:21 AM  Colton Armstrong  has presented today for surgery, with the diagnosis of RIGHT 4TH AND 5TH METACARPAL FRACTURES.  The various methods of treatment have been discussed with the patient and family. After consideration of risks, benefits and other options for treatment, the patient has consented to  Procedure(s) with comments: OPEN TREATMENT OF RIGHT 4TH AND 5TH METACARPAL FRACTURES (Right) - PRE-OP BLOCK, LENGTH OF SURGERY: 75 MINUTES as a surgical intervention.  The patient's history has been reviewed, patient examined, no change in status, stable for surgery.  I have reviewed the patient's chart and labs.  Questions were answered to the patient's satisfaction.     Jodi Marble

## 2022-01-19 NOTE — Progress Notes (Signed)
Assisted Dr. Woodrum with right, ultrasound guided, supraclavicular block. Side rails up, monitors on throughout procedure. See vital signs in flow sheet. Tolerated Procedure well. 

## 2022-01-19 NOTE — Op Note (Addendum)
01/19/2022  11:22 AM  PATIENT:  Colton Armstrong  26 y.o. male  PRE-OPERATIVE DIAGNOSIS: R 4th and 5th MC fx  POST-OPERATIVE DIAGNOSIS:  Same  PROCEDURE:   1. ORIF R 4th MC shaft fx with IMN, 41962    2. CRPP R 5 MC base fx, 26608  SURGEON: Rayvon Char. Grandville Silos, MD  PHYSICIAN ASSISTANT: Morley Kos, OPA-C  ANESTHESIA:  regional and MAC  SPECIMENS:  None  DRAINS:   None  EBL:   less than 10 mL  PREOPERATIVE INDICATIONS:  Colton Armstrong is a  26 y.o. male with displaced R 4th and 5th MC fxs  The risks benefits and alternatives were discussed with the patient preoperatively including but not limited to the risks of infection, bleeding, nerve injury, cardiopulmonary complications, the need for revision surgery, among others, and the patient verbalized understanding and consented to proceed.  OPERATIVE IMPLANTS: 0.062 inch K wire x1, 0.045 inch K wire x2  OPERATIVE PROCEDURE:  After receiving prophylactic antibiotics and a regional block, the patient was escorted to the operative theatre and placed in a supine position.  A surgical time-out was performed during which the planned procedure, proposed operative site, and the correct patient identity were compared to the operative consent and agreement confirmed by the circulating nurse according to current facility policy.  Following application of a tourniquet to the operative extremity, the exposed skin was prepped with Chloraprep and draped in the usual sterile fashion.  The limb was exsanguinated with an Esmarch bandage and the tourniquet inflated to approximately 133mHg higher than systolic BP.  The base of the fourth metacarpal was marked fluoroscopically and a small incision made just proximal to it.  Spreading dissection was carried down to the dorsal surface of the fourth metacarpal base, avoiding the extensor tendons.  The 0.062 inch K wire was then used to create a small perforation in the dorsum of the  fourth metacarpal just distal to the proximal articular surface.  The K wire was removed, the end clipped off and bent into a skid shaped and it was placed into a 3 jaw chuck to be advanced by hand as an intramedullary nail.  This was done and the fracture was reduced with manipulation to allow for advancement of the nail across the fracture site and into the distal portion of the metacarpal.  Alignment was near-anatomic and there was no digital malrotation.  Once advanced to the proper depth, it was bent over 90, 90 and clipped.  Axial load was placed upon it to better loaded and compress the fracture site.  Attention was then shifted to the fifth metacarpal where manipulation was performed to include some axial traction and dorsal and ulnar palpation to realign the base.  It was secured with 2 different 0.045 inch K wires.  These were clipped at the skin after being bent over 90 degrees.  Final images were obtained.  The digits have full flexion and extension passively and no malrotation.  A short arm splint dressing was applied with a volar plaster component and the tourniquet was released.  He was taken to the recovery room in stable condition.  DISPOSITION: He will be discharged home today with typical instructions, returning in 10 to 15 days for reevaluation with new x-rays of the right hand out of the splint and conversion to short arm cast.

## 2022-01-19 NOTE — Discharge Instructions (Addendum)
Discharge Instructions   You have a dressing with a plaster splint incorporated in it. Move your fingers as much as possible, making a full fist and fully opening the fist. Elevate your hand to reduce pain & swelling of the digits.  Ice over the operative site may be helpful to reduce pain & swelling.  DO NOT USE HEAT. Pain medicine has been prescribed for you.  Take Tylenol 650 mg and Ibuprofen 600 mg together, over the counter, every 6 hours. No Tylenol until after 5:00pm today. Take the Prescribed pain medicine additionally for severe post operative pain as a rescue medicine. Leave the dressing in place until you return to our office.  You may shower, but keep the bandage clean & dry.  You may drive a car when you are off of prescription pain medications and can safely control your vehicle with both hands. CALL our office to schedule a follow up appointment for 10-15 days from the date of surgery.   Please call [phone] during normal business hours or [phone] after hours for any problems. Including the following:  - excessive redness of the incisions - drainage for more than 4 days - fever of more than 101.5 F  *Please note that pain medications will not be refilled after hours or on weekends.   WORK STATUS: No work greater than pencil and paper tasks with the right hand.    Post Anesthesia Home Care Instructions  Activity: Get plenty of rest for the remainder of the day. A responsible individual must stay with you for 24 hours following the procedure.  For the next 24 hours, DO NOT: -Drive a car -Advertising copywriter -Drink alcoholic beverages -Take any medication unless instructed by your physician -Make any legal decisions or sign important papers.  Meals: Start with liquid foods such as gelatin or soup. Progress to regular foods as tolerated. Avoid greasy, spicy, heavy foods. If nausea and/or vomiting occur, drink only clear liquids until the nausea and/or vomiting  subsides. Call your physician if vomiting continues.  Special Instructions/Symptoms: Your throat may feel dry or sore from the anesthesia or the breathing tube placed in your throat during surgery. If this causes discomfort, gargle with warm salt water. The discomfort should disappear within 24 hours.  If you had a scopolamine patch placed behind your ear for the management of post- operative nausea and/or vomiting:  1. The medication in the patch is effective for 72 hours, after which it should be removed.  Wrap patch in a tissue and discard in the trash. Wash hands thoroughly with soap and water. 2. You may remove the patch earlier than 72 hours if you experience unpleasant side effects which may include dry mouth, dizziness or visual disturbances. 3. Avoid touching the patch. Wash your hands with soap and water after contact with the patch.    Regional Anesthesia Blocks  1. Numbness or the inability to move the "blocked" extremity may last from 3-48 hours after placement. The length of time depends on the medication injected and your individual response to the medication. If the numbness is not going away after 48 hours, call your surgeon.  2. The extremity that is blocked will need to be protected until the numbness is gone and the  Strength has returned. Because you cannot feel it, you will need to take extra care to avoid injury. Because it may be weak, you may have difficulty moving it or using it. You may not know what position it is in without looking  at it while the block is in effect.  3. For blocks in the legs and feet, returning to weight bearing and walking needs to be done carefully. You will need to wait until the numbness is entirely gone and the strength has returned. You should be able to move your leg and foot normally before you try and bear weight or walk. You will need someone to be with you when you first try to ensure you do not fall and possibly risk injury.  4. Bruising  and tenderness at the needle site are common side effects and will resolve in a few days.  5. Persistent numbness or new problems with movement should be communicated to the surgeon or the [facility] [phone] [facility] [phone]).

## 2022-01-20 ENCOUNTER — Encounter (HOSPITAL_BASED_OUTPATIENT_CLINIC_OR_DEPARTMENT_OTHER): Payer: Self-pay | Admitting: Orthopedic Surgery
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
